# Patient Record
Sex: Male | Born: 2000 | Race: White | Hispanic: No | Marital: Single | Smoking: Current every day smoker
Health system: Southern US, Community
[De-identification: ages and names within clinical notes are randomized; demographics above are authoritative.]

## PROBLEM LIST (undated history)

## (undated) ENCOUNTER — Ambulatory Visit: Payer: Self-pay | Source: Ambulatory Visit

## (undated) DIAGNOSIS — R51 Headache: Secondary | ICD-10-CM

## (undated) DIAGNOSIS — J45909 Unspecified asthma, uncomplicated: Secondary | ICD-10-CM

## (undated) DIAGNOSIS — I10 Essential (primary) hypertension: Secondary | ICD-10-CM

## (undated) HISTORY — DX: Unspecified asthma, uncomplicated: J45.909

## (undated) HISTORY — DX: Headache: R51

## (undated) HISTORY — DX: Essential (primary) hypertension: I10

---
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
## 2020-03-21 ENCOUNTER — Observation Stay: Payer: BC Managed Care – PPO

## 2020-03-21 ENCOUNTER — Encounter
Admission: EM | Disposition: A | Discharge status: Home / Self Care | Payer: Self-pay | Source: Home / Self Care | Attending: Emergency Medicine

## 2020-03-21 ENCOUNTER — Encounter: Payer: Self-pay | Admitting: Emergency Medicine

## 2020-03-21 ENCOUNTER — Emergency Department: Payer: BC Managed Care – PPO

## 2020-03-21 ENCOUNTER — Observation Stay: Payer: BC Managed Care – PPO | Admitting: Certified Registered"

## 2020-03-21 ENCOUNTER — Other Ambulatory Visit: Payer: Self-pay

## 2020-03-21 ENCOUNTER — Ambulatory Visit
Admission: EM | Admit: 2020-03-21 | Discharge: 2020-03-22 | Disposition: A | Discharge status: Home / Self Care | Payer: BC Managed Care – PPO | Attending: Orthopedic Surgery | Admitting: Orthopedic Surgery

## 2020-03-21 DIAGNOSIS — Z79899 Other long term (current) drug therapy: Secondary | ICD-10-CM | POA: Diagnosis not present

## 2020-03-21 DIAGNOSIS — Z8781 Personal history of (healed) traumatic fracture: Secondary | ICD-10-CM

## 2020-03-21 DIAGNOSIS — S52322A Displaced transverse fracture of shaft of left radius, initial encounter for closed fracture: Secondary | ICD-10-CM | POA: Diagnosis not present

## 2020-03-21 DIAGNOSIS — S52202A Unspecified fracture of shaft of left ulna, initial encounter for closed fracture: Secondary | ICD-10-CM | POA: Diagnosis not present

## 2020-03-21 DIAGNOSIS — S5292XA Unspecified fracture of left forearm, initial encounter for closed fracture: Secondary | ICD-10-CM | POA: Diagnosis present

## 2020-03-21 DIAGNOSIS — S52302A Unspecified fracture of shaft of left radius, initial encounter for closed fracture: Secondary | ICD-10-CM | POA: Insufficient documentation

## 2020-03-21 DIAGNOSIS — J45909 Unspecified asthma, uncomplicated: Secondary | ICD-10-CM | POA: Diagnosis not present

## 2020-03-21 DIAGNOSIS — F17290 Nicotine dependence, other tobacco product, uncomplicated: Secondary | ICD-10-CM | POA: Insufficient documentation

## 2020-03-21 DIAGNOSIS — Z03818 Encounter for observation for suspected exposure to other biological agents ruled out: Secondary | ICD-10-CM | POA: Diagnosis not present

## 2020-03-21 DIAGNOSIS — X58XXXA Exposure to other specified factors, initial encounter: Secondary | ICD-10-CM | POA: Insufficient documentation

## 2020-03-21 DIAGNOSIS — Z20822 Contact with and (suspected) exposure to covid-19: Secondary | ICD-10-CM | POA: Diagnosis not present

## 2020-03-21 DIAGNOSIS — I10 Essential (primary) hypertension: Secondary | ICD-10-CM | POA: Diagnosis not present

## 2020-03-21 DIAGNOSIS — F909 Attention-deficit hyperactivity disorder, unspecified type: Secondary | ICD-10-CM | POA: Insufficient documentation

## 2020-03-21 DIAGNOSIS — Z9889 Other specified postprocedural states: Secondary | ICD-10-CM

## 2020-03-21 HISTORY — PX: ORIF WRIST FRACTURE: SHX2133

## 2020-03-21 LAB — SARS CORONAVIRUS 2 BY RT PCR (HOSPITAL ORDER, PERFORMED IN ~~LOC~~ HOSPITAL LAB): SARS Coronavirus 2: NEGATIVE

## 2020-03-21 SURGERY — OPEN REDUCTION INTERNAL FIXATION (ORIF) WRIST FRACTURE
Anesthesia: General | Site: Wrist | Laterality: Left

## 2020-03-21 MED ORDER — CEFAZOLIN SODIUM 1 G IJ SOLR
INTRAMUSCULAR | Status: AC
  Filled 2020-03-21: qty 20

## 2020-03-21 MED ORDER — MIDAZOLAM HCL 2 MG/2ML IJ SOLN
INTRAMUSCULAR | Status: DC | PRN
  Administered 2020-03-21: 2 mg via INTRAVENOUS

## 2020-03-21 MED ORDER — HYDROCODONE-ACETAMINOPHEN 7.5-325 MG PO TABS
1.0000 | ORAL_TABLET | ORAL | Status: DC | PRN
  Administered 2020-03-21: 1 via ORAL
  Filled 2020-03-21: qty 1

## 2020-03-21 MED ORDER — FENTANYL CITRATE (PF) 100 MCG/2ML IJ SOLN
INTRAMUSCULAR | Status: AC
  Administered 2020-03-21: 25 ug via INTRAVENOUS
  Filled 2020-03-21: qty 2

## 2020-03-21 MED ORDER — NEOMYCIN-POLYMYXIN B GU 40-200000 IR SOLN
Status: DC | PRN
  Administered 2020-03-21: 2 mL

## 2020-03-21 MED ORDER — MIDAZOLAM HCL 2 MG/2ML IJ SOLN
INTRAMUSCULAR | Status: AC
  Filled 2020-03-21: qty 2

## 2020-03-21 MED ORDER — ACETAMINOPHEN 325 MG PO TABS: 325.0000 mg | ORAL_TABLET | Freq: Four times a day (QID) | ORAL | Status: DC | PRN

## 2020-03-21 MED ORDER — TRAMADOL HCL 50 MG PO TABS
50.0000 mg | ORAL_TABLET | Freq: Four times a day (QID) | ORAL | Status: DC
  Administered 2020-03-21 – 2020-03-22 (×2): 50 mg via ORAL
  Filled 2020-03-21 (×3): qty 1

## 2020-03-21 MED ORDER — KETOROLAC TROMETHAMINE 30 MG/ML IJ SOLN
INTRAMUSCULAR | Status: DC | PRN
  Administered 2020-03-21: 30 mg via INTRAVENOUS

## 2020-03-21 MED ORDER — ONDANSETRON HCL 4 MG/2ML IJ SOLN
INTRAMUSCULAR | Status: AC
  Administered 2020-03-21: 4 mg via INTRAVENOUS
  Filled 2020-03-21: qty 2

## 2020-03-21 MED ORDER — FENTANYL CITRATE (PF) 100 MCG/2ML IJ SOLN
25.0000 ug | INTRAMUSCULAR | Status: DC | PRN
  Administered 2020-03-21 (×4): 25 ug via INTRAVENOUS

## 2020-03-21 MED ORDER — METHOCARBAMOL 1000 MG/10ML IJ SOLN
500.0000 mg | Freq: Four times a day (QID) | INTRAVENOUS | Status: DC | PRN
  Filled 2020-03-21: qty 5

## 2020-03-21 MED ORDER — ONDANSETRON HCL 4 MG/2ML IJ SOLN: 4.0000 mg | Freq: Once | INTRAMUSCULAR | Status: AC | PRN

## 2020-03-21 MED ORDER — ACETAMINOPHEN 10 MG/ML IV SOLN
INTRAVENOUS | Status: AC
  Filled 2020-03-21: qty 100

## 2020-03-21 MED ORDER — METOCLOPRAMIDE HCL 5 MG/ML IJ SOLN: 5.0000 mg | Freq: Three times a day (TID) | INTRAMUSCULAR | Status: DC | PRN

## 2020-03-21 MED ORDER — FENTANYL CITRATE (PF) 100 MCG/2ML IJ SOLN
INTRAMUSCULAR | Status: AC
  Filled 2020-03-21: qty 2

## 2020-03-21 MED ORDER — METOCLOPRAMIDE HCL 10 MG PO TABS: 5.0000 mg | ORAL_TABLET | Freq: Three times a day (TID) | ORAL | Status: DC | PRN

## 2020-03-21 MED ORDER — ZOLPIDEM TARTRATE 5 MG PO TABS: 5.0000 mg | ORAL_TABLET | Freq: Every evening | ORAL | Status: DC | PRN

## 2020-03-21 MED ORDER — ONDANSETRON HCL 4 MG/2ML IJ SOLN: 4.0000 mg | Freq: Four times a day (QID) | INTRAMUSCULAR | Status: DC | PRN

## 2020-03-21 MED ORDER — LACTATED RINGERS IV SOLN: INTRAVENOUS | Status: DC | PRN

## 2020-03-21 MED ORDER — DEXAMETHASONE SODIUM PHOSPHATE 10 MG/ML IJ SOLN
INTRAMUSCULAR | Status: DC | PRN
  Administered 2020-03-21: 5 mg via INTRAVENOUS

## 2020-03-21 MED ORDER — MORPHINE SULFATE (PF) 2 MG/ML IV SOLN
2.0000 mg | INTRAVENOUS | Status: DC | PRN
  Administered 2020-03-21: 2 mg via INTRAVENOUS
  Filled 2020-03-21: qty 1

## 2020-03-21 MED ORDER — LIDOCAINE HCL (CARDIAC) PF 100 MG/5ML IV SOSY
PREFILLED_SYRINGE | INTRAVENOUS | Status: DC | PRN
  Administered 2020-03-21: 100 mg via INTRAVENOUS

## 2020-03-21 MED ORDER — HYDROCODONE-ACETAMINOPHEN 5-325 MG PO TABS
1.0000 | ORAL_TABLET | ORAL | Status: DC | PRN
  Administered 2020-03-21: 1 via ORAL
  Filled 2020-03-21: qty 1

## 2020-03-21 MED ORDER — DEXMEDETOMIDINE HCL 200 MCG/2ML IV SOLN
INTRAVENOUS | Status: DC | PRN
  Administered 2020-03-21: 12 ug via INTRAVENOUS
  Administered 2020-03-21: 8 ug via INTRAVENOUS

## 2020-03-21 MED ORDER — PHENYLEPHRINE HCL (PRESSORS) 10 MG/ML IV SOLN
INTRAVENOUS | Status: DC | PRN
  Administered 2020-03-21 (×2): 100 ug via INTRAVENOUS

## 2020-03-21 MED ORDER — FENTANYL CITRATE (PF) 100 MCG/2ML IJ SOLN
INTRAMUSCULAR | Status: DC | PRN
  Administered 2020-03-21 (×2): 100 ug via INTRAVENOUS

## 2020-03-21 MED ORDER — METHOCARBAMOL 500 MG PO TABS
500.0000 mg | ORAL_TABLET | Freq: Four times a day (QID) | ORAL | Status: DC | PRN
  Administered 2020-03-21 – 2020-03-22 (×2): 500 mg via ORAL
  Filled 2020-03-21 (×2): qty 1

## 2020-03-21 MED ORDER — MORPHINE SULFATE (PF) 2 MG/ML IV SOLN
0.5000 mg | INTRAVENOUS | Status: DC | PRN
  Administered 2020-03-21: 1 mg via INTRAVENOUS
  Filled 2020-03-21: qty 1

## 2020-03-21 MED ORDER — FENTANYL CITRATE (PF) 100 MCG/2ML IJ SOLN
50.0000 ug | INTRAMUSCULAR | Status: DC | PRN
  Administered 2020-03-21: 50 ug via NASAL
  Filled 2020-03-21: qty 2

## 2020-03-21 MED ORDER — CEFAZOLIN SODIUM-DEXTROSE 2-4 GM/100ML-% IV SOLN
2.0000 g | INTRAVENOUS | Status: AC
  Administered 2020-03-21: 2 g via INTRAVENOUS
  Filled 2020-03-21: qty 100

## 2020-03-21 MED ORDER — ONDANSETRON HCL 4 MG PO TABS: 4.0000 mg | ORAL_TABLET | Freq: Four times a day (QID) | ORAL | Status: DC | PRN

## 2020-03-21 MED ORDER — PROPOFOL 10 MG/ML IV BOLUS
INTRAVENOUS | Status: DC | PRN
  Administered 2020-03-21: 200 mg via INTRAVENOUS

## 2020-03-21 MED ORDER — DOCUSATE SODIUM 100 MG PO CAPS
100.0000 mg | ORAL_CAPSULE | Freq: Two times a day (BID) | ORAL | Status: DC
  Administered 2020-03-21: 100 mg via ORAL
  Filled 2020-03-21: qty 1

## 2020-03-21 MED ORDER — CEFAZOLIN SODIUM-DEXTROSE 2-4 GM/100ML-% IV SOLN
2.0000 g | Freq: Four times a day (QID) | INTRAVENOUS | Status: DC
  Filled 2020-03-21: qty 100

## 2020-03-21 MED ORDER — ONDANSETRON HCL 4 MG/2ML IJ SOLN
INTRAMUSCULAR | Status: DC | PRN
  Administered 2020-03-21: 4 mg via INTRAVENOUS

## 2020-03-21 MED ORDER — SEVOFLURANE IN SOLN
RESPIRATORY_TRACT | Status: AC
  Filled 2020-03-21: qty 250

## 2020-03-21 MED ORDER — ACETAMINOPHEN 10 MG/ML IV SOLN
INTRAVENOUS | Status: DC | PRN
  Administered 2020-03-21: 1000 mg via INTRAVENOUS

## 2020-03-21 MED ORDER — KETOROLAC TROMETHAMINE 30 MG/ML IJ SOLN
INTRAMUSCULAR | Status: AC
  Filled 2020-03-21: qty 1

## 2020-03-21 MED ORDER — SODIUM CHLORIDE 0.9 % IV SOLN: INTRAVENOUS | Status: DC

## 2020-03-21 SURGICAL SUPPLY — 42 items
BIT DRILL 110X2.5XQCK CNCT (BIT) ×1 IMPLANT
BIT DRILL 2.5 (BIT) ×3
BIT DRL 110X2.5XQCK CNCT (BIT) ×1
BNDG ELASTIC 4X5.8 VLCR NS LF (GAUZE/BANDAGES/DRESSINGS) ×3 IMPLANT
BNDG ELASTIC 4X5.8 VLCR STR LF (GAUZE/BANDAGES/DRESSINGS) ×3 IMPLANT
CANISTER SUCT 1200ML W/VALVE (MISCELLANEOUS) ×3 IMPLANT
CHLORAPREP W/TINT 26 (MISCELLANEOUS) ×3 IMPLANT
COVER WAND RF STERILE (DRAPES) ×3 IMPLANT
CUFF TOURN SGL QUICK 18X4 (TOURNIQUET CUFF) ×3 IMPLANT
DRAPE FLUOR MINI C-ARM 54X84 (DRAPES) ×3 IMPLANT
ELECT CAUTERY BLADE 6.4 (BLADE) ×3 IMPLANT
ELECT REM PT RETURN 9FT ADLT (ELECTROSURGICAL) ×3
ELECTRODE REM PT RTRN 9FT ADLT (ELECTROSURGICAL) ×1 IMPLANT
GAUZE SPONGE 4X4 12PLY STRL (GAUZE/BANDAGES/DRESSINGS) ×3 IMPLANT
GAUZE XEROFORM 1X8 LF (GAUZE/BANDAGES/DRESSINGS) ×3 IMPLANT
GLOVE SURG SYN 9.0  PF PI (GLOVE) ×3
GLOVE SURG SYN 9.0 PF PI (GLOVE) ×1 IMPLANT
GOWN SRG 2XL LVL 4 RGLN SLV (GOWNS) ×1 IMPLANT
GOWN STRL NON-REIN 2XL LVL4 (GOWNS) ×3
GOWN STRL REUS W/ TWL LRG LVL3 (GOWN DISPOSABLE) ×1 IMPLANT
GOWN STRL REUS W/TWL LRG LVL3 (GOWN DISPOSABLE) ×3
KIT TURNOVER KIT A (KITS) ×3 IMPLANT
NEEDLE FILTER BLUNT 18X 1/2SAF (NEEDLE) ×2
NEEDLE FILTER BLUNT 18X1 1/2 (NEEDLE) ×1 IMPLANT
NS IRRIG 500ML POUR BTL (IV SOLUTION) ×3 IMPLANT
PACK EXTREMITY (MISCELLANEOUS) ×3 IMPLANT
PAD CAST CTTN 4X4 STRL (SOFTGOODS) ×1 IMPLANT
PADDING CAST COTTON 4X4 STRL (SOFTGOODS) ×3
PROS LCP PLATE 6H 85MM (Plate) ×6 IMPLANT
PROSTHESIS LCP PLATE 6H 85MM (Plate) ×2 IMPLANT
SCALPEL PROTECTED #15 DISP (BLADE) ×6 IMPLANT
SCREW CORTICAL 3.5MM  16MM (Screw) ×6 IMPLANT
SCREW CORTICAL 3.5MM  20MM (Screw) ×12 IMPLANT
SCREW CORTICAL 3.5MM 16MM (Screw) ×2 IMPLANT
SCREW CORTICAL 3.5MM 18MM (Screw) ×18 IMPLANT
SCREW CORTICAL 3.5MM 20MM (Screw) ×4 IMPLANT
SPLINT CAST 1 STEP 3X12 (MISCELLANEOUS) ×3 IMPLANT
SUT ETHILON 4-0 (SUTURE) ×3
SUT ETHILON 4-0 FS2 18XMFL BLK (SUTURE) ×1
SUT VICRYL 3-0 27IN (SUTURE) ×3 IMPLANT
SUTURE ETHLN 4-0 FS2 18XMF BLK (SUTURE) ×1 IMPLANT
SYR 3ML LL SCALE MARK (SYRINGE) ×3 IMPLANT

## 2020-03-21 NOTE — ED Notes (Signed)
Dr. Menz in with pt and family   

## 2020-03-21 NOTE — Op Note (Signed)
03/21/2020  6:35 PM  PATIENT:  Marliss Coots  19 y.o. male  PRE-OPERATIVE DIAGNOSIS:  Left Forearm Fracture both bone midshaft  POST-OPERATIVE DIAGNOSIS:  Left Forearm Fracture both bone midshaft  PROCEDURE: Open reduction internal fixation left ulna and left radius  SURGEON: Leitha Schuller, MD  ASSISTANTS: None  ANESTHESIA:   general  EBL:  Total I/O In: 800 [I.V.:700; IV Piggyback:100] Out: 10 [Blood:10]  BLOOD ADMINISTERED:none  DRAINS: none   LOCAL MEDICATIONS USED:  NONE  SPECIMEN:  No Specimen  DISPOSITION OF SPECIMEN:  N/A  COUNTS:  YES  TOURNIQUET:   Total Tourniquet Time Documented: Upper Arm (Left) - 52 minutes Total: Upper Arm (Left) - 52 minutes   IMPLANTS: 6 hole small fragment Synthes plates with multiple cortical screws  DICTATION: .Dragon Dictation patient was brought to the operating room and after adequate anesthesia was obtained the left arm was prepped and draped in the usual sterile fashion.  After patient identification and timeout procedures were completed, tourniquet was raised to 250 mmHg.  The ulnar was approached first with direct exposure to the ulna centered over the fracture site with the skin and subcutaneous tissue divided hemostasis achieved electrocautery dividing the flexors from the extensors off the ulnar border.  When this was done the there was significant periosteal stripping already present and this was extended proximally and distally on the volar surface such that the distal fragment could be grasped with a bone forceps and reduced anatomically to the shaft more proximally.  There was splintering that made interdigitation and could anatomic reduction possible.  After this was done a 6-hole plate was applied and cortical screws were placed with proximal and distal screws followed by compression screw in a neutral screws alignment was anatomic under mini C arm views in both AP and lateral projections.  Going to the radius a volar  approach was made with division between the FCR and brachial radialis and getting down to the bone protecting the superficial radial nerve and radial artery.  The periosteum was stripped here as well and was minimally displaced after reduction of the ulna with application of a volar plate and proximal distal screw fixation there was full pronation and supination and alignment seemed anatomic for both fractures.  The remaining screws were filled with a compression screw to get further compression at the fracture site.  When all screw holes were filled the wound was thoroughly irrigated and tourniquet let down.  There is no significant bleeding.  The wound was closed with 3-0 Vicryl subcutaneously and 4-0 nylon in short running sutures.  Xeroform 4 x 4's web roll and a splint were applied to maintain neutral supination pronation allow for soft tissue healing.  Along with an Ace wrap.  PLAN OF CARE: Admit for overnight observation  PATIENT DISPOSITION:  PACU - hemodynamically stable.

## 2020-03-21 NOTE — Anesthesia Procedure Notes (Signed)
Procedure Name: LMA Insertion Date/Time: 03/21/2020 4:49 PM Performed by: Jaye Beagle, CRNA Pre-anesthesia Checklist: Patient identified, Emergency Drugs available, Suction available, Patient being monitored and Timeout performed Patient Re-evaluated:Patient Re-evaluated prior to induction Oxygen Delivery Method: Circle system utilized Preoxygenation: Pre-oxygenation with 100% oxygen Induction Type: IV induction Ventilation: Mask ventilation without difficulty LMA: LMA inserted LMA Size: 4.5 Number of attempts: 1 Tube secured with: Tape Dental Injury: Teeth and Oropharynx as per pre-operative assessment

## 2020-03-21 NOTE — Anesthesia Postprocedure Evaluation (Signed)
Anesthesia Post Note  Patient: Keith Perez  Procedure(s) Performed: OPEN REDUCTION INTERNAL FIXATION (ORIF) WRIST FRACTURE (Left Wrist)  Patient location during evaluation: PACU Anesthesia Type: General Level of consciousness: awake and alert Pain management: pain level controlled Vital Signs Assessment: post-procedure vital signs reviewed and stable Respiratory status: spontaneous breathing and respiratory function stable Cardiovascular status: stable Anesthetic complications: no   No complications documented.   Last Vitals:  Vitals:   03/21/20 1918 03/21/20 1936  BP: 128/79 138/69  Pulse: 77 75  Resp: 15 16  Temp: 36.9 C 36.9 C  SpO2: 93% 98%    Last Pain:  Vitals:   03/21/20 1936  TempSrc: Oral  PainSc:                  Kadarius Cuffe K

## 2020-03-21 NOTE — ED Notes (Addendum)
See triage note   Presents s/p fall from Keith Perez he was on a slight incline fell  Caught his arm on the fence Injury to left forearm  positive deformity noted    Good pulses

## 2020-03-21 NOTE — ED Triage Notes (Signed)
Patient to ER for c/o left arm injury after accident during mowing. Patient states he was on an incline and mower started to slip out of from under him. States he tried to catch himself and got arm caught in fence, heard a snap. Patient has possible deformity to left forearm with small puncture wounds present with bleeding controlled.

## 2020-03-21 NOTE — ED Provider Notes (Signed)
[facility] Emergency Department Provider Note   ____________________________________________   First MD Initiated Contact with Patient 03/21/20 1341     (approximate)  I have reviewed the triage vital signs and the nursing notes.   HISTORY  Chief Complaint Arm Injury    HPI Keith Perez is a 19 y.o. male patient presents with deformity to left forearm secondary to a fall from a lawnmower.  Patient state his arm was caught in a fence as he fell he felt a crack resulting in deformity.  Patient rates pain as 9/10.  Patient described the pain is "achy".  Ice pack applied prior to arrival.         Past Medical History:  Diagnosis Date  . Asthma   . Headache(784.0)   . Hypertension     Patient Active Problem List   Diagnosis Date Noted  . Forearm fractures, both bones, closed, left, initial encounter 03/21/2020  . Tension headache 08/16/2013  . Anxiety state, unspecified 08/16/2013  . ADHD (attention deficit hyperactivity disorder) 08/16/2013    History reviewed. No pertinent surgical history.  Prior to Admission medications   Medication Sig Start Date End Date Taking? Authorizing Provider  Albuterol Sulfate (PROAIR HFA IN) Inhale 2 puffs into the lungs every 4 (four) hours as needed.    [provider]  ibuprofen (ADVIL,MOTRIN) 200 MG tablet Take 400 mg by mouth every 6 (six) hours as needed.    [provider]  LISINOPRIL PO Take by mouth.    [provider]  magnesium gluconate (MAGONATE) 500 MG tablet Take 500 mg by mouth 2 (two) times daily.    [provider]  propranolol (INDERAL) 20 MG tablet Take 1 tablet (20 mg total) by mouth 2 (two) times daily. (Start with 20 mg by mouth each bedtime for the first week) 08/16/13   Keturah Shavers, MD  VYVANSE 40 MG capsule 10 mg.  08/07/13   [provider]    Allergies Other  Family History  Problem Relation Age of Onset  . ADD / ADHD Mother         ADD  . Depression Mother   . Irritable bowel syndrome Mother   . ADD / ADHD Father        ADHD  . ADD / ADHD Paternal Aunt        ADHD  . Depression Maternal Grandmother   . Anxiety disorder Maternal Grandmother   . Cancer Maternal Grandmother   . Sleep apnea Maternal Grandmother   . Diabetes Other        Fhx of Diabetes  . Hypertension Other        Fhx Hypertension  . Heart disease Other   . Stroke Other   . Cancer Paternal Grandfather     Social History Social History   Tobacco Use  . Smoking status: Never Smoker  . Smokeless tobacco: Never Used  Vaping Use  . Vaping Use: Every day  Substance Use Topics  . Alcohol use: Never  . Drug use: Not on file    Review of Systems  Constitutional: No fever/chills Eyes: No visual changes. ENT: No sore throat. Cardiovascular: Denies chest pain. Respiratory: Denies shortness of breath. Gastrointestinal: No abdominal pain.  No nausea, no vomiting.  No diarrhea.  No constipation. Genitourinary: Negative for dysuria. Musculoskeletal: Deformity left forearm. Skin: Negative for rash. Neurological: Negative for headaches, focal weakness or numbness. Endocrine:  Hypertension ____________________________________________   PHYSICAL EXAM:  VITAL SIGNS: ED Triage  Vitals [03/21/20 1253]  Enc Vitals Group     BP 130/81     Pulse Rate (!) 50     Resp 20     Temp 98 F (36.7 C)     Temp Source Oral     SpO2 99 %     Weight 215 lb (97.5 kg)     Height 5\' 10"  (1.778 m)     Head Circumference      Peak Flow      Pain Score 9     Pain Loc      Pain Edu?      Excl. in Blunt?    Constitutional: Alert and oriented. Well appearing and in no acute distress. Cardiovascular: Normal rate, regular rhythm. Grossly normal heart sounds.  Good peripheral circulation. Respiratory: Normal respiratory effort.  No retractions. Lungs CTAB. Musculoskeletal: Obvious deformity to the midshaft left forearm.   Neurologic:  Normal speech and  language. No gross focal neurologic deficits are appreciated. No gait instability. Skin:  Skin is warm, dry and intact. No rash noted. Psychiatric: Mood and affect are normal. Speech and behavior are normal.  ____________________________________________   LABS (all labs ordered are listed, but only abnormal results are displayed)  Labs Reviewed  SARS CORONAVIRUS 2 BY RT PCR (HOSPITAL ORDER, [facility])   ____________________________________________  EKG   ____________________________________________  [place]  ED MD interpretation:    Official radiology report(s): DG Forearm Left  Result Date: 03/21/2020 CLINICAL DATA:  Left arm pain following a fall and crush injury. EXAM: LEFT FOREARM - 2 VIEW COMPARISON:  None. FINDINGS: Transverse fractures of the mid shafts of the radius and ulna with ulnar angulation and rotation of the distal fragments. There is also almost 1 shaft width of ventral displacement and 1/2 shaft width of radial displacement of the distal ulna fragment. IMPRESSION: Displaced and angulated radial and ulnar shaft fractures, as described above. Electronically Signed   By: Claudie Revering M.D.   On: 03/21/2020 14:15    ____________________________________________   PROCEDURES  Procedure(s) performed (including Critical Care):  Procedures   ____________________________________________   INITIAL IMPRESSION / ASSESSMENT AND PLAN / ED COURSE  As part of my medical decision making, I reviewed the following data within the [facility]     Patient presents with midshaft ulnar radial fracture secondary to fall.  Discussed x-ray findings with patient.  Patient will be admitted by Dr. Rudene Christians.          ____________________________________________   FINAL CLINICAL IMPRESSION(S) / ED DIAGNOSES  Final diagnoses:  Closed fracture of left forearm, initial encounter     ED Discharge Orders    None       Note:   This document was prepared using Dragon voice recognition software and may include unintentional dictation errors.    Sable Feil, PA-C 03/21/20 1513    Harvest Dark, MD 03/21/20 [phone]

## 2020-03-21 NOTE — H&P (Signed)
Subjective:   Patient is a 19 y.o. male presents with left forearm pain. Onset of symptoms was abrupt starting 2 hours ago with unchanged course since that time. The pain is located in the mid forearm. Patient describes the pain as sharp continuous and rated as moderate and severe. Pain has been associated with fall and crush injury he was riding a lawnmower when it slid down the hill.  Pressing his left arm against a fence he had immediate deformity and was brought to the emergency room was found to have both bone forearm fracture on the left side as well as volar abrasion to the forearm. Patient denies loss of consciousness.  He is right-hand dominant and very physically active  Patient Active Problem List   Diagnosis Date Noted  . Forearm fractures, both bones, closed, left, initial encounter 03/21/2020  . Tension headache 08/16/2013  . Anxiety state, unspecified 08/16/2013  . ADHD (attention deficit hyperactivity disorder) 08/16/2013   Past Medical History:  Diagnosis Date  . Asthma   . Headache(784.0)   . Hypertension     History reviewed. No pertinent surgical history.  (Not in a hospital admission)  Allergies  Allergen Reactions  . Other     Seasonal    Social History   Tobacco Use  . Smoking status: Never Smoker  . Smokeless tobacco: Never Used  Substance Use Topics  . Alcohol use: Never    Family History  Problem Relation Age of Onset  . ADD / ADHD Mother        ADD  . Depression Mother   . Irritable bowel syndrome Mother   . ADD / ADHD Father        ADHD  . ADD / ADHD Paternal Aunt        ADHD  . Depression Maternal Grandmother   . Anxiety disorder Maternal Grandmother   . Cancer Maternal Grandmother   . Sleep apnea Maternal Grandmother   . Diabetes Other        Fhx of Diabetes  . Hypertension Other        Fhx Hypertension  . Heart disease Other   . Stroke Other   . Cancer Paternal Grandfather     Review of Systems Pertinent items are noted in  HPI.  Objective:   Patient Vitals for the past 8 hrs:  BP Temp Temp src Pulse Resp SpO2 Height Weight  03/21/20 1253 130/81 98 F (36.7 C) Oral (!) 50 20 99 % 5\' 10"  (1.778 m) 97.5 kg   No intake/output data recorded. No intake/output data recorded.    BP 130/81 (BP Location: Left Arm)   Pulse (!) 50   Temp 98 F (36.7 C) (Oral)   Resp 20   Ht 5\' 10"  (1.778 m)   Wt 97.5 kg   SpO2 99%   BMI 30.85 kg/m  Lungs: clear to auscultation bilaterally Heart: regular rate and rhythm, S1, S2 normal, no murmur, click, rub or gallop Extremities: He has slight deformity to the mid forearm with an abrasion to the volar skin in the midshaft area.  This is superficial no does not appear to have any deep penetrating wound.  Or inside out bone lesion.  Distally he has palpable radial artery pulses has intact sensation in the radial ulnar and median nerve distributions.  He is able to flex extend the fingers.  ECG: None.  Data ReviewRadiology review: Both bone forearm fracture with some displacement.  Assessment:   Active Problems:   Forearm fractures,  both bones, closed, left, initial encounter   Plan:   ORIF both bones.  Discussed with him and his mother that this is a fracture that requires ORIF in adults.  Risk of neurovascular injury and infection.  And potential need for plate removal in the future.  As he has not had anything to eat or drink today we will proceed with surgery today before he gets more swelling which will make the case more difficult.  We will be performing surgery after Covid testing completed.

## 2020-03-21 NOTE — Anesthesia Preprocedure Evaluation (Signed)
Anesthesia Evaluation  Patient identified by MRN, date of birth, ID band Patient awake    Reviewed: Allergy & Precautions, NPO status , Patient's Chart, lab work & pertinent test results  History of Anesthesia Complications Negative for: history of anesthetic complications  Airway Mallampati: II       Dental   Pulmonary asthma (no inhalers x 7 yrs) , neg sleep apnea, Current Smoker (vapes),           Cardiovascular hypertension (no meds since 40 lb weight loss), (-) Past MI and (-) CHF (-) dysrhythmias (-) Valvular Problems/Murmurs     Neuro/Psych neg Seizures    GI/Hepatic Neg liver ROS, neg GERD  ,  Endo/Other  neg diabetes  Renal/GU negative Renal ROS     Musculoskeletal   Abdominal   Peds  Hematology   Anesthesia Other Findings   Reproductive/Obstetrics                             Anesthesia Physical Anesthesia Plan  ASA: II and emergent  Anesthesia Plan: General   Post-op Pain Management:    Induction: Intravenous  PONV Risk Score and Plan: 2  Airway Management Planned: LMA  Additional Equipment:   Intra-op Plan:   Post-operative Plan:   Informed Consent: I have reviewed the patients History and Physical, chart, labs and discussed the procedure including the risks, benefits and alternatives for the proposed anesthesia with the patient or authorized representative who has indicated his/her understanding and acceptance.       Plan Discussed with:   Anesthesia Plan Comments:         Anesthesia Quick Evaluation

## 2020-03-21 NOTE — Transfer of Care (Signed)
Immediate Anesthesia Transfer of Care Note  Patient: Keith Perez  Procedure(s) Performed: OPEN REDUCTION INTERNAL FIXATION (ORIF) WRIST FRACTURE (Left Wrist)  Patient Location: PACU  Anesthesia Type:General  Level of Consciousness: drowsy  Airway & Oxygen Therapy: Patient Spontanous Breathing and Patient connected to nasal cannula oxygen  Post-op Assessment: Report given to RN  Post vital signs: stable  Last Vitals:  Vitals Value Taken Time  BP 115/76 03/21/20 1832  Temp 36.1 C 03/21/20 1832  Pulse 104 03/21/20 1834  Resp 19 03/21/20 1834  SpO2 97 % 03/21/20 1834  Vitals shown include unvalidated device data.  Last Pain:  Vitals:   03/21/20 1253  TempSrc: Oral  PainSc: 9          Complications: No complications documented.

## 2020-03-22 MED ORDER — METHOCARBAMOL 500 MG PO TABS: 500.0000 mg | ORAL_TABLET | Freq: Four times a day (QID) | ORAL | 0 refills | Status: DC | PRN

## 2020-03-22 MED ORDER — TRAMADOL HCL 50 MG PO TABS: 50.0000 mg | ORAL_TABLET | Freq: Four times a day (QID) | ORAL | 0 refills | Status: DC

## 2020-03-22 MED ORDER — HYDROCODONE-ACETAMINOPHEN 5-325 MG PO TABS: 1.0000 | ORAL_TABLET | ORAL | 0 refills | Status: DC | PRN

## 2020-03-22 NOTE — Progress Notes (Signed)
Patient is being discharged to home today. Mom is bedside to take him home.  IV removed. DC & Rx instructions given and patient acknowledged understanding.

## 2020-03-22 NOTE — Discharge Instructions (Signed)
INSTRUCTIONS AFTER Surgery  o Remove items at home which could result in a fall. This includes throw rugs or furniture in walking pathways o ICE to the affected joint every three hours while awake for 30 minutes at a time, for at least the first 3-5 days, and then as needed for pain and swelling.  Continue to use ice for pain and swelling. You may notice swelling that will progress down to the foot and ankle.  This is normal after surgery.  Elevate your leg when you are not up walking on it.     DIET:  As you were doing prior to hospitalization, we recommend a well-balanced diet.  DRESSING / WOUND CARE / SHOWERING  Keep the surgical dressing until follow up.  The dressing is water proof, so you can shower without any extra covering.  IF THE DRESSING FALLS OFF or the wound gets wet inside, change the dressing with sterile gauze.  Please use good hand washing techniques before changing the dressing.  Do not use any lotions or creams on the incision until instructed by your surgeon.    ACTIVITY  o Increase activity slowly as tolerated, but follow the weight bearing instructions below.   o No driving for 6 weeks or until further direction given by your physician.  You cannot drive while taking narcotics.  o No lifting or carrying greater than 10 lbs. until further directed by your surgeon. o Avoid periods of inactivity such as sitting longer than an hour when not asleep. This helps prevent blood clots.  o You may return to work once you are authorized by your doctor.     WEIGHT BEARING   Other:  Keep the shoulder sling on for activities.  Give extra support to the forearm.  Can be removed when resting.  No getting the forearm wet.   EXERCISES  Gentle wrist and finger exercises.  No forearm exercises.  CONSTIPATION  Constipation is defined medically as fewer than three stools per week and severe constipation as less than one stool per week.  Even if you have a regular bowel pattern at  home, your normal regimen is likely to be disrupted due to multiple reasons following surgery.  Combination of anesthesia, postoperative narcotics, change in appetite and fluid intake all can affect your bowels.   YOU MUST use at least one of the following options; they are listed in order of increasing strength to get the job done.  They are all available over the counter, and you may need to use some, POSSIBLY even all of these options:    Drink plenty of fluids (prune juice may be helpful) and high fiber foods Colace 100 mg by mouth twice a day  Senokot for constipation as directed and as needed Dulcolax (bisacodyl), take with full glass of water  Miralax (polyethylene glycol) once or twice a day as needed.  If you have tried all these things and are unable to have a bowel movement in the first 3-4 days after surgery call either your surgeon or your primary doctor.    If you experience loose stools or diarrhea, hold the medications until you stool forms back up.  If your symptoms do not get better within 1 week or if they get worse, check with your doctor.  If you experience "the worst abdominal pain ever" or develop nausea or vomiting, please contact the office immediately for further recommendations for treatment.   ITCHING:  If you experience itching with your medications, try taking  only a single pain pill, or even half a pain pill at a time.  You can also use Benadryl over the counter for itching or also to help with sleep.   MEDICATIONS:  See your medication summary on the "After Visit Summary" that nursing will review with you.  You may have some home medications which will be placed on hold until you complete the course of blood thinner medication.  It is important for you to complete the blood thinner medication as prescribed.  PRECAUTIONS:  If you experience chest pain or shortness of breath - call 911 immediately for transfer to the hospital emergency department.   If you develop a  fever greater that 101 F, purulent drainage from wound, increased redness or drainage from wound, foul odor from the wound/dressing, or calf pain - CONTACT YOUR SURGEON.                                                   FOLLOW-UP APPOINTMENTS:  If you do not already have a post-op appointment, please call the office for an appointment to be seen by your surgeon.  Guidelines for how soon to be seen are listed in your "After Visit Summary", but are typically between 1-2 weeks after surgery.  OTHER INSTRUCTIONS:   Limit activities with the left arm.  MAKE SURE YOU:  . Understand these instructions.  . Get help right away if you are not doing well or get worse.    Thank you for letting us be a part of your medical care team.  It is a privilege we respect greatly.  We hope these instructions will help you stay on track for a fast and full recovery!

## 2020-03-22 NOTE — Discharge Summary (Addendum)
Physician Discharge Summary  Subjective: 1 Day Post-Op Procedure(s) (LRB): OPEN REDUCTION INTERNAL FIXATION (ORIF) WRIST FRACTURE (Left) Patient reports pain as mild.   Patient seen in rounds with Dr. Rudene Christians. Patient is well, and has had no acute complaints or problems Patient is ready to go home  Physician Discharge Summary  Patient ID: Keith Perez MRN: [id number] DOB/AGE: 2001-01-23 19 y.o.  Admit date: 03/21/2020 Discharge date: 03/22/2020  Admission Diagnoses:  Discharge Diagnoses:  Active Problems:   Forearm fractures, both bones, closed, left, initial encounter   S/P ORIF (open reduction internal fixation) fracture   Discharged Condition: fair  Hospital Course: The patient is postop day 1 from a left forearm open reduction internal fixation.  He is doing very well since surgery.  His pain control is more manageable this morning.  He has been able to move his hand and fingers well and has good sensation.  He is ready to go home.  Treatments: surgery:  Open reduction internal fixation left ulna and left radius  SURGEON: Laurene Footman, MD  ASSISTANTS: None  ANESTHESIA:   general  EBL:  Total I/O In: 800 [I.V.:700; IV Piggyback:100] Out: 10 [Blood:10]  BLOOD ADMINISTERED:none  DRAINS: none   LOCAL MEDICATIONS USED:  NONE  SPECIMEN:  No Specimen  DISPOSITION OF SPECIMEN:  N/A  COUNTS:  YES  TOURNIQUET:   Total Tourniquet Time Documented: Upper Arm (Left) - 52 minutes Total: Upper Arm (Left) - 52 minutes   IMPLANTS: 6 hole small fragment Synthes plates with multiple cortical screws  Discharge Exam: Blood pressure 109/62, pulse 96, temperature 98.2 F (36.8 C), temperature source Oral, resp. rate 16, height 5\' 11"  (1.803 m), weight 103.2 kg, SpO2 99 %.   Disposition: Discharge disposition: 01-Home or Self Care        Allergies as of 03/22/2020      Reactions   Other Other (See Comments)   Seasonal Seasonal      Medication  List    TAKE these medications   HYDROcodone-acetaminophen 5-325 MG tablet Commonly known as: NORCO/VICODIN Take 1-2 tablets by mouth every 4 (four) hours as needed for moderate pain (pain score 4-6).   methocarbamol 500 MG tablet Commonly known as: ROBAXIN Take 1 tablet (500 mg total) by mouth every 6 (six) hours as needed for muscle spasms.   traMADol 50 MG tablet Commonly known as: ULTRAM Take 1 tablet (50 mg total) by mouth every 6 (six) hours.       Follow-up Information    Hessie Knows, MD. Schedule an appointment as soon as possible for a visit in 1 week(s).   Specialty: Orthopedic Surgery Why: For x-rays of the forearm Contact information: [place] [address] [phone]               Signed: Prescott Parma, Eiman Maret 03/22/2020, 7:31 AM   Objective: Vital signs in last 24 hours: Temp:  [97 F (36.1 C)-98.5 F (36.9 C)] 98.2 F (36.8 C) (06/12 0407) Pulse Rate:  [50-98] 96 (06/12 0407) Resp:  [9-20] 16 (06/12 0407) BP: (109-155)/(62-89) 109/62 (06/12 0407) SpO2:  [90 %-100 %] 99 % (06/12 0407) Weight:  [97.5 kg-103.2 kg] 103.2 kg (06/11 1936)  Intake/Output from previous day:  Intake/Output Summary (Last 24 hours) at 03/22/2020 0731 Last data filed at 03/22/2020 0200 Gross per 24 hour  Intake 2723.33 ml  Output 510 ml  Net 2213.33 ml    Intake/Output this shift: No intake/output data recorded.  Labs:  No results for input(s): HGB in the last 72 hours. No results for input(s): WBC, RBC, HCT, PLT in the last 72 hours. No results for input(s): NA, K, CL, CO2, BUN, CREATININE, GLUCOSE, CALCIUM in the last 72 hours. No results for input(s): LABPT, INR in the last 72 hours.  EXAM: General - Patient is Alert and Oriented Extremity - Neurovascular intact Sensation intact distally Compartment soft Incision - clean, dry, no drainage Motor Function -flexion and extension of the fingers and hand  intact.  Assessment/Plan: 1 Day Post-Op Procedure(s) (LRB): OPEN REDUCTION INTERNAL FIXATION (ORIF) WRIST FRACTURE (Left) Procedure(s) (LRB): OPEN REDUCTION INTERNAL FIXATION (ORIF) WRIST FRACTURE (Left) Past Medical History:  Diagnosis Date  . Asthma   . Headache(784.0)   . Hypertension    Active Problems:   Forearm fractures, both bones, closed, left, initial encounter   S/P ORIF (open reduction internal fixation) fracture  Estimated body mass index is 31.73 kg/m as calculated from the following:   Height as of this encounter: 5\' 11"  (1.803 m).   Weight as of this encounter: 103.2 kg. Advance diet Diet - Regular diet Follow up - in 1-2 weeks Activity - WBAT Disposition - Home Condition Upon Discharge - Stable DVT Prophylaxis - None  , PA-C Orthopaedic Surgery 03/22/2020, 7:31 AM

## 2020-03-22 NOTE — Progress Notes (Signed)
  Subjective: 1 Day Post-Op Procedure(s) (LRB): OPEN REDUCTION INTERNAL FIXATION (ORIF) WRIST FRACTURE (Left) Patient reports pain as mild.   Patient is well, and has had no acute complaints or problems Plan is to go Home after hospital stay. Negative for chest pain and shortness of breath Fever: no Gastrointestinal: Negative for nausea and vomiting  Objective: Vital signs in last 24 hours: Temp:  [97 F (36.1 C)-98.5 F (36.9 C)] 98.2 F (36.8 C) (06/12 0407) Pulse Rate:  [50-98] 96 (06/12 0407) Resp:  [9-20] 16 (06/12 0407) BP: (109-155)/(62-89) 109/62 (06/12 0407) SpO2:  [90 %-100 %] 99 % (06/12 0407) Weight:  [97.5 kg-103.2 kg] 103.2 kg (06/11 1936)  Intake/Output from previous day:  Intake/Output Summary (Last 24 hours) at 03/22/2020 0630 Last data filed at 03/22/2020 0200 Gross per 24 hour  Intake 2723.33 ml  Output 510 ml  Net 2213.33 ml    Intake/Output this shift: Total I/O In: 1923.3 [P.O.:240; I.V.:1683.3] Out: 500 [Urine:500]  Labs: No results for input(s): HGB in the last 72 hours. No results for input(s): WBC, RBC, HCT, PLT in the last 72 hours. No results for input(s): NA, K, CL, CO2, BUN, CREATININE, GLUCOSE, CALCIUM in the last 72 hours. No results for input(s): LABPT, INR in the last 72 hours.   EXAM General - Patient is Alert and Oriented Extremity - Neurovascular intact Sensation intact distally Dorsiflexion/Plantar flexion intact Compartment soft Dressing/Incision - clean, dry, no drainage Motor Function - intact, moving fingers and hand well on exam.   Past Medical History:  Diagnosis Date  . Asthma   . Headache(784.0)   . Hypertension     Assessment/Plan: 1 Day Post-Op Procedure(s) (LRB): OPEN REDUCTION INTERNAL FIXATION (ORIF) WRIST FRACTURE (Left) Active Problems:   Forearm fractures, both bones, closed, left, initial encounter   S/P ORIF (open reduction internal fixation) fracture  Estimated body mass index is 31.73 kg/m as  calculated from the following:   Height as of this encounter: 5\' 11"  (1.803 m).   Weight as of this encounter: 103.2 kg. Advance diet  Discharge home  DVT Prophylaxis - None Shoulder sling for forearm support with activities.  , PA-C Orthopaedic Surgery 03/22/2020, 6:30 AM

## 2020-03-24 ENCOUNTER — Encounter: Payer: Self-pay | Admitting: Orthopedic Surgery

## 2020-03-26 DIAGNOSIS — Z8781 Personal history of (healed) traumatic fracture: Secondary | ICD-10-CM | POA: Diagnosis not present

## 2020-03-26 DIAGNOSIS — Z9889 Other specified postprocedural states: Secondary | ICD-10-CM | POA: Diagnosis not present

## 2020-04-04 DIAGNOSIS — Z8781 Personal history of (healed) traumatic fracture: Secondary | ICD-10-CM | POA: Diagnosis not present

## 2020-04-04 DIAGNOSIS — Z9889 Other specified postprocedural states: Secondary | ICD-10-CM | POA: Diagnosis not present

## 2020-05-02 DIAGNOSIS — Z8781 Personal history of (healed) traumatic fracture: Secondary | ICD-10-CM | POA: Diagnosis not present

## 2020-05-02 DIAGNOSIS — Z9889 Other specified postprocedural states: Secondary | ICD-10-CM | POA: Diagnosis not present

## 2020-06-09 ENCOUNTER — Ambulatory Visit
Admission: EM | Admit: 2020-06-09 | Discharge: 2020-06-09 | Disposition: A | Discharge status: Home / Self Care | Payer: BC Managed Care – PPO | Attending: Internal Medicine | Admitting: Internal Medicine

## 2020-06-09 DIAGNOSIS — Z20822 Contact with and (suspected) exposure to covid-19: Secondary | ICD-10-CM | POA: Diagnosis not present

## 2020-06-09 NOTE — Discharge Instructions (Signed)

## 2020-06-11 LAB — NOVEL CORONAVIRUS, NAA: SARS-CoV-2, NAA: DETECTED — AB

## 2020-06-25 DIAGNOSIS — Z8781 Personal history of (healed) traumatic fracture: Secondary | ICD-10-CM | POA: Diagnosis not present

## 2020-06-25 DIAGNOSIS — Z9889 Other specified postprocedural states: Secondary | ICD-10-CM | POA: Diagnosis not present

## 2021-03-27 ENCOUNTER — Encounter: Payer: Self-pay | Admitting: Family Medicine

## 2021-03-27 ENCOUNTER — Other Ambulatory Visit: Payer: Self-pay

## 2021-03-27 ENCOUNTER — Ambulatory Visit: Payer: BC Managed Care – PPO | Admitting: Family Medicine

## 2021-03-27 VITALS — BP 120/86 | HR 57 | Ht 70.0 in | Wt 221.2 lb

## 2021-03-27 DIAGNOSIS — R454 Irritability and anger: Secondary | ICD-10-CM

## 2021-03-27 DIAGNOSIS — Z7689 Persons encountering health services in other specified circumstances: Secondary | ICD-10-CM | POA: Diagnosis not present

## 2021-03-27 MED ORDER — CITALOPRAM HYDROBROMIDE 10 MG PO TABS: 10.0000 mg | ORAL_TABLET | Freq: Every day | ORAL | 3 refills | Status: DC

## 2021-03-27 MED ORDER — LORAZEPAM 1 MG PO TABS
1.0000 mg | ORAL_TABLET | Freq: Two times a day (BID) | ORAL | 1 refills | Status: DC | PRN
Start: 2021-03-27 — End: 2021-10-19

## 2021-03-27 NOTE — Assessment & Plan Note (Signed)
Patient describes quickness to anger that has been happening for some time now. He thinks his mother is Bipolar but does not seek treatment. He denies increased energy, spending or depression. He denies SI/Hi.   Plan- Ref to Psych for evaluation./ Starting Celexa 10 mg with Prn Ativan.

## 2021-03-27 NOTE — Progress Notes (Signed)
Amb psy

## 2021-03-27 NOTE — Progress Notes (Signed)
Established Patient Office Visit  SUBJECTIVE:  Subjective  Patient ID: Keith Perez, male    DOB: [birth year]  Age: 20 y.o. MRN: [id number]  CC:  Chief Complaint  Patient presents with   New Patient (Initial Visit)    HPI Keith Perez is a 20 y.o. male presenting today for   Past Medical History:  Diagnosis Date   Asthma    Headache(784.0)    Hypertension     Past Surgical History:  Procedure Laterality Date   ORIF WRIST FRACTURE Left 03/21/2020   Procedure: OPEN REDUCTION INTERNAL FIXATION (ORIF) WRIST FRACTURE;  Surgeon: Hessie Knows, MD;  Location: [facility] ORS;  Service: Orthopedics;  Laterality: Left;    Family History  Problem Relation Age of Onset   ADD / ADHD Mother        ADD   Depression Mother    Irritable bowel syndrome Mother    ADD / ADHD Father        ADHD   ADD / ADHD Paternal Aunt        ADHD   Depression Maternal Grandmother    Anxiety disorder Maternal Grandmother    Cancer Maternal Grandmother    Sleep apnea Maternal Grandmother    Diabetes Other        Fhx of Diabetes   Hypertension Other        Fhx Hypertension   Heart disease Other    Stroke Other    Cancer Paternal Grandfather     Social History   Socioeconomic History   Marital status: Single    Spouse name: Not on file   Number of children: Not on file   Years of education: Not on file   Highest education level: Not on file  Occupational History   Not on file  Tobacco Use   Smoking status: Every Day    Pack years: 0.00    Types: E-cigarettes   Smokeless tobacco: Never  Vaping Use   Vaping Use: Every day  Substance and Sexual Activity   Alcohol use: Yes   Drug use: Yes    Types: Marijuana   Sexual activity: Yes  Other Topics Concern   Not on file  Social History Narrative   Not on file   Social Determinants of Health   Financial Resource Strain: Not on file  Food Insecurity: Not on file  Transportation Needs: Not on file  Physical Activity: Not on file   Stress: Not on file  Social Connections: Not on file  Intimate Partner Violence: Not on file     Current Outpatient Medications:    citalopram (CELEXA) 10 MG tablet, Take 1 tablet (10 mg total) by mouth daily., Disp: 30 tablet, Rfl: 3   LORazepam (ATIVAN) 1 MG tablet, Take 1 tablet (1 mg total) by mouth 2 (two) times daily as needed for anxiety., Disp: 20 tablet, Rfl: 1   Allergies  Allergen Reactions   Other Other (See Comments)    Seasonal Seasonal    ROS Review of Systems  Constitutional: Negative.   HENT: Negative.    Respiratory: Negative.    Cardiovascular: Negative.   Genitourinary: Negative.   Neurological: Negative.   Hematological: Negative.   Psychiatric/Behavioral: Negative.      OBJECTIVE:    Physical Exam Constitutional:      Appearance: Normal appearance.  HENT:     Head: Normocephalic.  Cardiovascular:     Rate and Rhythm: Normal rate and regular rhythm.  Psychiatric:  Mood and Affect: Affect is angry.        Behavior: Behavior normal.    BP 120/86   Pulse (!) 57   Ht 5' 10"  (1.778 m)   Wt 221 lb 3.2 oz (100.3 kg)   BMI 31.74 kg/m  Wt Readings from Last 3 Encounters:  03/27/21 221 lb 3.2 oz (100.3 kg) (97 %, Z= 1.87)*  03/21/20 227 lb 8.2 oz (103.2 kg) (98 %, Z= 2.06)*  08/16/13 118 lb 6.4 oz (53.7 kg) (87 %, Z= 1.15)*   * Growth percentiles are based on CDC (Boys, 2-20 Years) data.    Health Maintenance Due  Topic Date Due   COVID-19 Vaccine (1) Never done   Pneumococcal Vaccine 34-17 Years old (1 - PCV) Never done   HPV VACCINES (1 - Male 2-dose series) Never done   HIV Screening  Never done   Hepatitis C Screening  Never done   TETANUS/TDAP  Never done       Topic Date Due   HPV VACCINES (1 - Male 2-dose series) Never done    No flowsheet data found. No flowsheet data found.  No results found for: TSH No results found for: ALBUMIN, ANIONGAP, EGFR, GFR No results found for: CHOL, HDL, LDLCALC, CHOLHDL No results  found for: TRIG No results found for: HGBA1C    ASSESSMENT & PLAN:   Problem List Items Addressed This Visit       Other   Encounter to establish care - Primary    Patient in today to establish care, health good in general. This is his first adult provider.        Difficulty controlling anger    Patient describes quickness to anger that has been happening for some time now. He thinks his mother is Bipolar but does not seek treatment. He denies increased energy, spending or depression. He denies SI/Hi.   Plan- Ref to Psych for evaluation./ Starting Celexa 10 mg with Prn Ativan.        Relevant Orders   Ambulatory referral to Psychiatry    Meds ordered this encounter  Medications   citalopram (CELEXA) 10 MG tablet    Sig: Take 1 tablet (10 mg total) by mouth daily.    Dispense:  30 tablet    Refill:  3   LORazepam (ATIVAN) 1 MG tablet    Sig: Take 1 tablet (1 mg total) by mouth 2 (two) times daily as needed for anxiety.    Dispense:  20 tablet    Refill:  1      Follow-up: No follow-ups on file.    Beckie Salts, [place] [address] [place], [place] [address]

## 2021-03-27 NOTE — Assessment & Plan Note (Addendum)
Patient in today to establish care, health good in general. This is his first adult provider.

## 2021-04-10 ENCOUNTER — Ambulatory Visit: Payer: BC Managed Care – PPO | Admitting: Family Medicine

## 2021-05-15 ENCOUNTER — Other Ambulatory Visit: Payer: Self-pay | Admitting: Family Medicine

## 2021-10-13 ENCOUNTER — Emergency Department: Payer: Self-pay

## 2021-10-13 ENCOUNTER — Other Ambulatory Visit: Payer: Self-pay

## 2021-10-13 ENCOUNTER — Emergency Department
Admission: EM | Admit: 2021-10-13 | Discharge: 2021-10-13 | Disposition: A | Discharge status: Home / Self Care | Payer: Self-pay | Attending: Emergency Medicine | Admitting: Emergency Medicine

## 2021-10-13 DIAGNOSIS — R197 Diarrhea, unspecified: Secondary | ICD-10-CM | POA: Insufficient documentation

## 2021-10-13 DIAGNOSIS — R079 Chest pain, unspecified: Secondary | ICD-10-CM | POA: Insufficient documentation

## 2021-10-13 DIAGNOSIS — R0602 Shortness of breath: Secondary | ICD-10-CM | POA: Insufficient documentation

## 2021-10-13 DIAGNOSIS — Z5321 Procedure and treatment not carried out due to patient leaving prior to being seen by health care provider: Secondary | ICD-10-CM | POA: Insufficient documentation

## 2021-10-13 LAB — CBC
HCT: 48.6 % (ref 39.0–52.0)
Hemoglobin: 17.2 g/dL — ABNORMAL HIGH (ref 13.0–17.0)
MCH: 29.5 pg (ref 26.0–34.0)
MCHC: 35.4 g/dL (ref 30.0–36.0)
MCV: 83.2 fL (ref 80.0–100.0)
Platelets: 400 10*3/uL (ref 150–400)
RBC: 5.84 MIL/uL — ABNORMAL HIGH (ref 4.22–5.81)
RDW: 12 % (ref 11.5–15.5)
WBC: 12.1 10*3/uL — ABNORMAL HIGH (ref 4.0–10.5)
nRBC: 0 % (ref 0.0–0.2)

## 2021-10-13 LAB — BASIC METABOLIC PANEL
Anion gap: 12 (ref 5–15)
BUN: 14 mg/dL (ref 6–20)
CO2: 22 mmol/L (ref 22–32)
Calcium: 9.9 mg/dL (ref 8.9–10.3)
Chloride: 106 mmol/L (ref 98–111)
Creatinine, Ser: 1 mg/dL (ref 0.61–1.24)
GFR, Estimated: >60 mL/min (ref >60)
Glucose, Bld: 123 mg/dL — ABNORMAL HIGH (ref 70–99)
Potassium: 3.9 mmol/L (ref 3.5–5.1)
Sodium: 140 mmol/L (ref 135–145)

## 2021-10-13 LAB — TROPONIN I (HIGH SENSITIVITY)
Troponin I (High Sensitivity): <2 ng/L (ref <18)
Troponin I (High Sensitivity): 3 ng/L (ref <18)

## 2021-10-13 NOTE — ED Triage Notes (Signed)
Pt to ED for centralized chest pain that started over past few days. Reports a lot of stress recently.  Pt hyperventilating in triage, states feeling tingling. Pt encouraged to take slow deep breaths.  +diarrhea started this am.  +shob started earlier this week.   MSE Sammie Bench in triage

## 2021-10-13 NOTE — ED Provider Notes (Signed)
°  Emergency Medicine Provider Triage Evaluation Note  Keith Perez , a 20 y.o.male,  was evaluated in triage.  Pt complains of chest pain.  This been going on intermittently for the past 2 weeks.  He says he has been extremely stressed this past week.  He is currently feeling central chest pain and is hyperventilating.  States that he feels tingling in his arms.  Denies fever/chills, abdominal pain, back pain, urinary symptoms   Review of Systems  Positive: Chest pain Negative: Denies fever, chest pain, vomiting  Physical Exam   Vitals:   10/13/21 1514 10/13/21 1519  BP:  (!) 149/124  Pulse: (!) 117   Resp: (!) 22   Temp:  98 F (36.7 C)  SpO2: 100%    Gen:   Awake, distressed Resp:  Tachypneic MSK:   Moves extremities without difficulty  Other:    Medical Decision Making  Given the patient's initial medical screening exam, the following diagnostic evaluation has been ordered. The patient will be placed in the appropriate treatment space, once one is available, to complete the evaluation and treatment. I have discussed the plan of care with the patient and I have advised the patient that an ED physician or mid-level practitioner will reevaluate their condition after the test results have been received, as the results may give them additional insight into the type of treatment they may need.    Diagnostics: Labs, EKG, CXR  Treatments: none immediately   Varney Daily, [place] 10/13/21 1523    Phineas Semen, MD 10/13/21 [phone]

## 2021-10-19 ENCOUNTER — Ambulatory Visit (INDEPENDENT_AMBULATORY_CARE_PROVIDER_SITE_OTHER): Payer: Self-pay | Admitting: Internal Medicine

## 2021-10-19 ENCOUNTER — Encounter: Payer: Self-pay | Admitting: Internal Medicine

## 2021-10-19 ENCOUNTER — Other Ambulatory Visit: Payer: Self-pay

## 2021-10-19 VITALS — BP 135/81 | HR 95 | Ht 70.0 in | Wt 203.6 lb

## 2021-10-19 DIAGNOSIS — R454 Irritability and anger: Secondary | ICD-10-CM

## 2021-10-19 DIAGNOSIS — F411 Generalized anxiety disorder: Secondary | ICD-10-CM

## 2021-10-19 DIAGNOSIS — K219 Gastro-esophageal reflux disease without esophagitis: Secondary | ICD-10-CM

## 2021-10-19 DIAGNOSIS — I10 Essential (primary) hypertension: Secondary | ICD-10-CM

## 2021-10-19 NOTE — Progress Notes (Signed)
New Patient Office Visit  Subjective:  Patient ID: Keith Perez, male    DOB: 2001/04/26  Age: 21 y.o. MRN: [id number]  CC:  Chief Complaint  Patient presents with   Hypertension    Chest Pain  This is a new problem. The current episode started in the past 7 days. The problem occurs every several days. The pain is present in the substernal region. The pain is at a severity of 2/10. The pain is mild. The quality of the pain is described as burning. The pain does not radiate. Pertinent negatives include no abdominal pain, back pain, cough, dizziness, exertional chest pressure, irregular heartbeat, lower extremity edema, shortness of breath, vomiting or weakness. He has tried nothing for the symptoms.  Patient presents for bp check up  Past Medical History:  Diagnosis Date   Asthma    Headache(784.0)    Hypertension     No current outpatient medications on file.   Past Surgical History:  Procedure Laterality Date   ORIF WRIST FRACTURE Left 03/21/2020   Procedure: OPEN REDUCTION INTERNAL FIXATION (ORIF) WRIST FRACTURE;  Surgeon: Hessie Knows, MD;  Location: [facility] ORS;  Service: Orthopedics;  Laterality: Left;    Family History  Problem Relation Age of Onset   ADD / ADHD Mother        ADD   Depression Mother    Irritable bowel syndrome Mother    ADD / ADHD Father        ADHD   ADD / ADHD Paternal Aunt        ADHD   Depression Maternal Grandmother    Anxiety disorder Maternal Grandmother    Cancer Maternal Grandmother    Sleep apnea Maternal Grandmother    Diabetes Other        Fhx of Diabetes   Hypertension Other        Fhx Hypertension   Heart disease Other    Stroke Other    Cancer Paternal Grandfather     Social History   Socioeconomic History   Marital status: Single    Spouse name: Not on file   Number of children: Not on file   Years of education: Not on file   Highest education level: Not on file  Occupational History   Not on file  Tobacco Use    Smoking status: Every Day    Types: E-cigarettes   Smokeless tobacco: Never  Vaping Use   Vaping Use: Every day  Substance and Sexual Activity   Alcohol use: Yes   Drug use: Yes    Types: Marijuana   Sexual activity: Yes  Other Topics Concern   Not on file  Social History Narrative   Not on file   Social Determinants of Health   Financial Resource Strain: Not on file  Food Insecurity: Not on file  Transportation Needs: Not on file  Physical Activity: Not on file  Stress: Not on file  Social Connections: Not on file  Intimate Partner Violence: Not on file    ROS Review of Systems  Respiratory:  Negative for cough and shortness of breath.   Cardiovascular:  Positive for chest pain.  Gastrointestinal:  Negative for abdominal pain and vomiting.  Musculoskeletal:  Negative for back pain.  Neurological:  Negative for dizziness and weakness.   Objective:   Today's Vitals: BP 135/81    Pulse 95    Ht 5\' 10"  (1.778 m)    Wt 203 lb 9.6 oz (92.4 kg)  BMI 29.21 kg/m   Physical Exam Nursing note reviewed.  Constitutional:      Appearance: Normal appearance.  HENT:     Head: Normocephalic.     Nose: Nose normal.     Mouth/Throat:     Mouth: Mucous membranes are moist.  Eyes:     Pupils: Pupils are equal, round, and reactive to light.  Cardiovascular:     Rate and Rhythm: Normal rate.     Heart sounds: No murmur heard. Pulmonary:     Effort: Pulmonary effort is normal.     Breath sounds: Normal breath sounds.  Abdominal:     General: Bowel sounds are normal. There is no distension.  Musculoskeletal:        General: Normal range of motion.  Skin:    General: Skin is warm and dry.  Neurological:     General: No focal deficit present.  Psychiatric:        Mood and Affect: Mood normal.        Thought Content: Thought content normal.    Assessment & Plan:   Problem List Items Addressed This Visit       Cardiovascular and Mediastinum   Hypertension - Primary     Blood pressure is under control      Relevant Orders   EKG 12-Lead     Digestive   Chronic GERD      - Instructed the patient  .to avoid eating spicy and acidic foods, as well as foods high in fat. - Instructed the patient to avoid eating large meals or meals 2-3 hours prior to sleeping. priloec 20 mg po daily        Other   Anxiety state    - Patient experiencing high levels of anxiety.  - Encouraged patient to engage in relaxing activities like yoga, meditation, journaling, going for a walk, or participating in a hobby.  - Encouraged patient to reach out to trusted friends or family members about recent struggles, Patient was advised to read A book, how to stop worrying and start living, it is good book to read to control  the stress       Difficulty controlling anger    Stable at the present time       Outpatient Encounter Medications as of 10/19/2021  Medication Sig   [DISCONTINUED] LORazepam (ATIVAN) 1 MG tablet Take 1 tablet (1 mg total) by mouth 2 (two) times daily as needed for anxiety.   [DISCONTINUED] citalopram (CELEXA) 10 MG tablet TAKE 1 TABLET BY MOUTH EVERY DAY (Patient not taking: Reported on 10/19/2021)   No facility-administered encounter medications on file as of 10/19/2021.  Electrocardiogram does not reveal any acute changes, sinus tachycardia is noted  Follow-up: No follow-ups on file.   Cletis Athens, MD

## 2021-10-19 NOTE — Assessment & Plan Note (Signed)
°-   Instructed the patient  .to avoid eating spicy and acidic foods, as well as foods high in fat. - Instructed the patient to avoid eating large meals or meals 2-3 hours prior to sleeping. priloec 20 mg po daily

## 2021-10-19 NOTE — Assessment & Plan Note (Signed)
Blood pressure is under control 

## 2021-10-30 NOTE — Assessment & Plan Note (Signed)
Stable at the present time. 

## 2021-10-30 NOTE — Assessment & Plan Note (Signed)
-   Patient experiencing high levels of anxiety.  - Encouraged patient to engage in relaxing activities like yoga, meditation, journaling, going for a walk, or participating in a hobby.  - Encouraged patient to reach out to trusted friends or family members about recent struggles, Patient was advised to read A book, how to stop worrying and start living, it is good book to read to control  the stress  

## 2021-12-15 ENCOUNTER — Ambulatory Visit: Payer: Self-pay | Admitting: Internal Medicine

## 2023-07-16 IMAGING — CR DG CHEST 2V
2 series · 2 of 2 positions shown · non-contrast
Comparison: 01/08/2009

CLINICAL DATA: Cough and short of breath

EXAM:
CHEST - 2 VIEW

[chest pa]
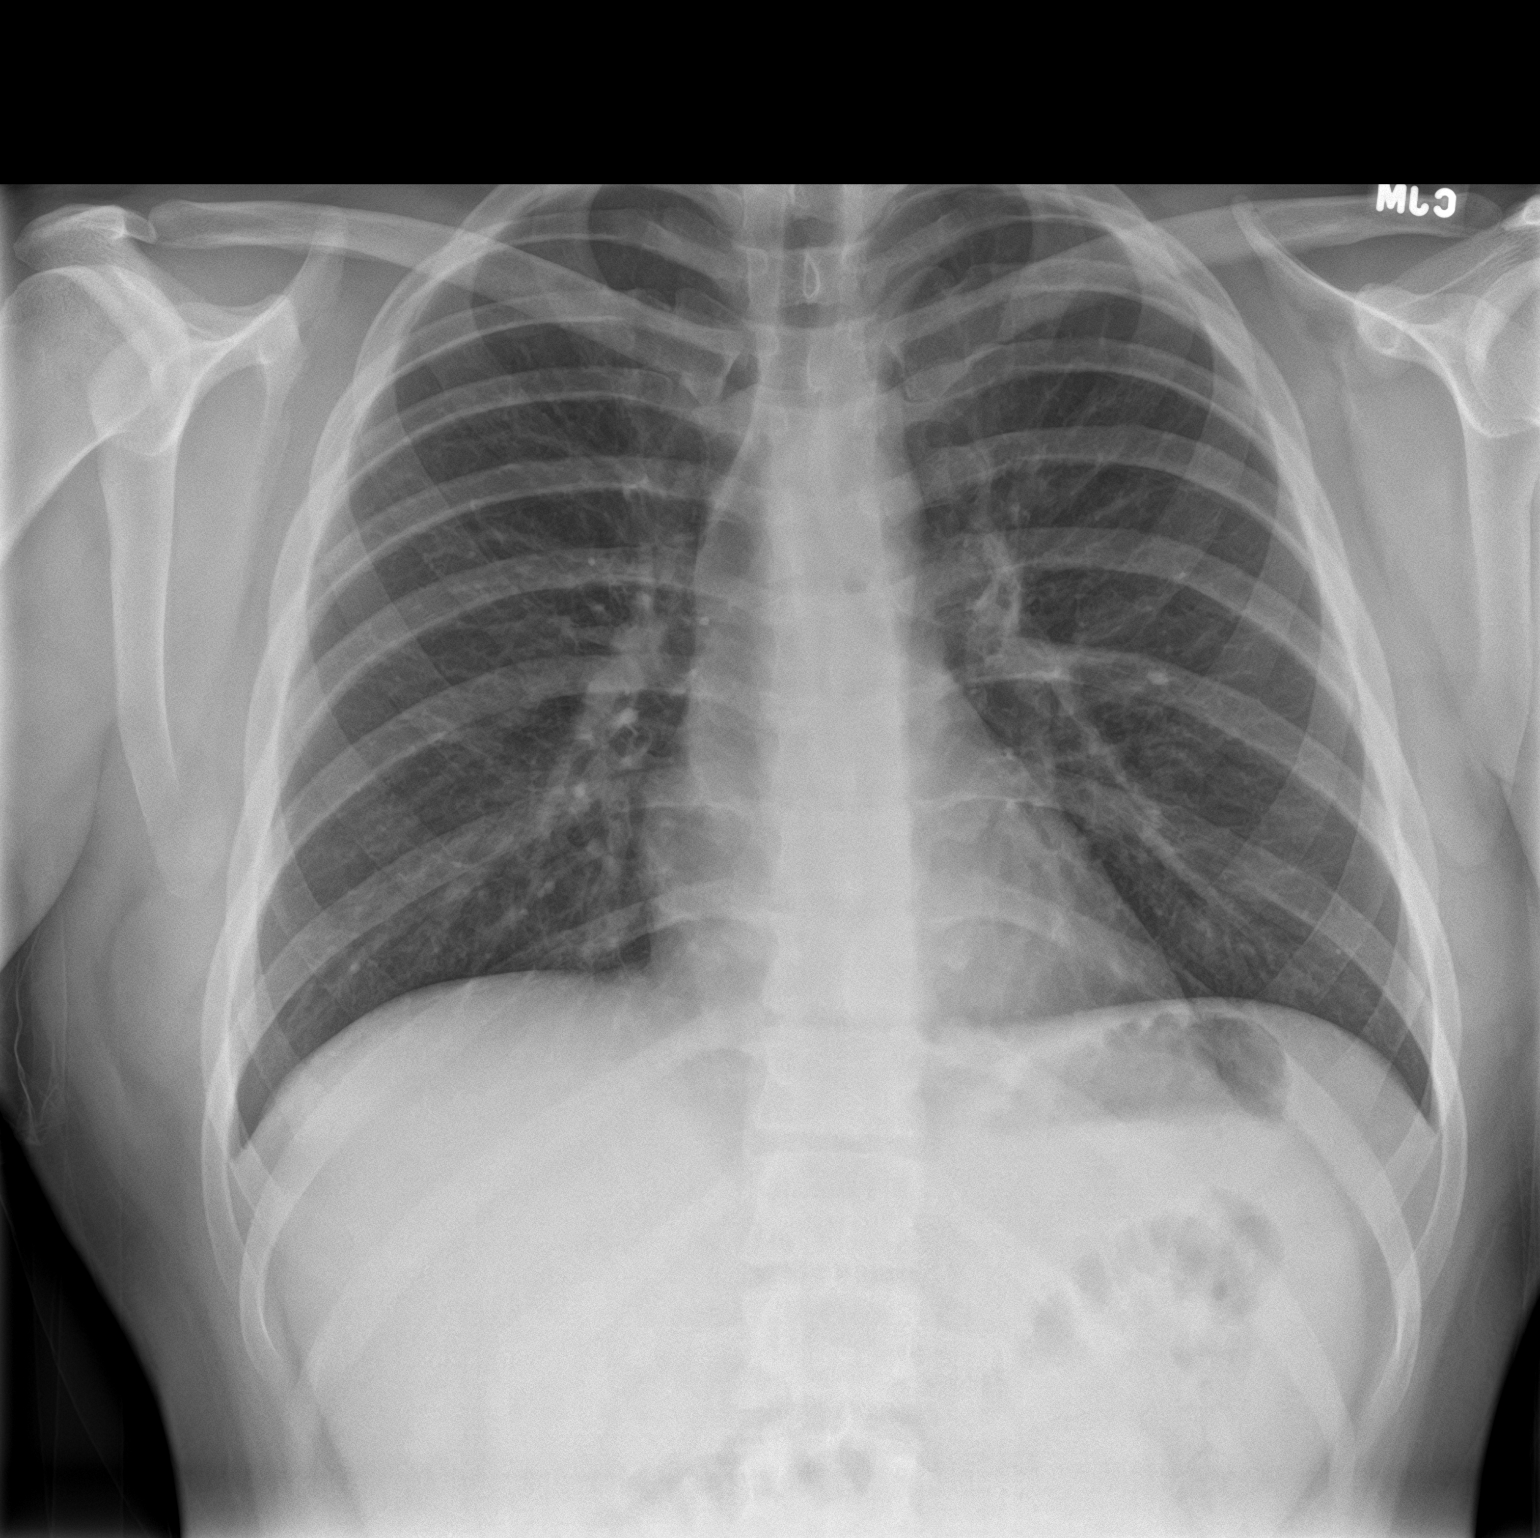

[chest lat]
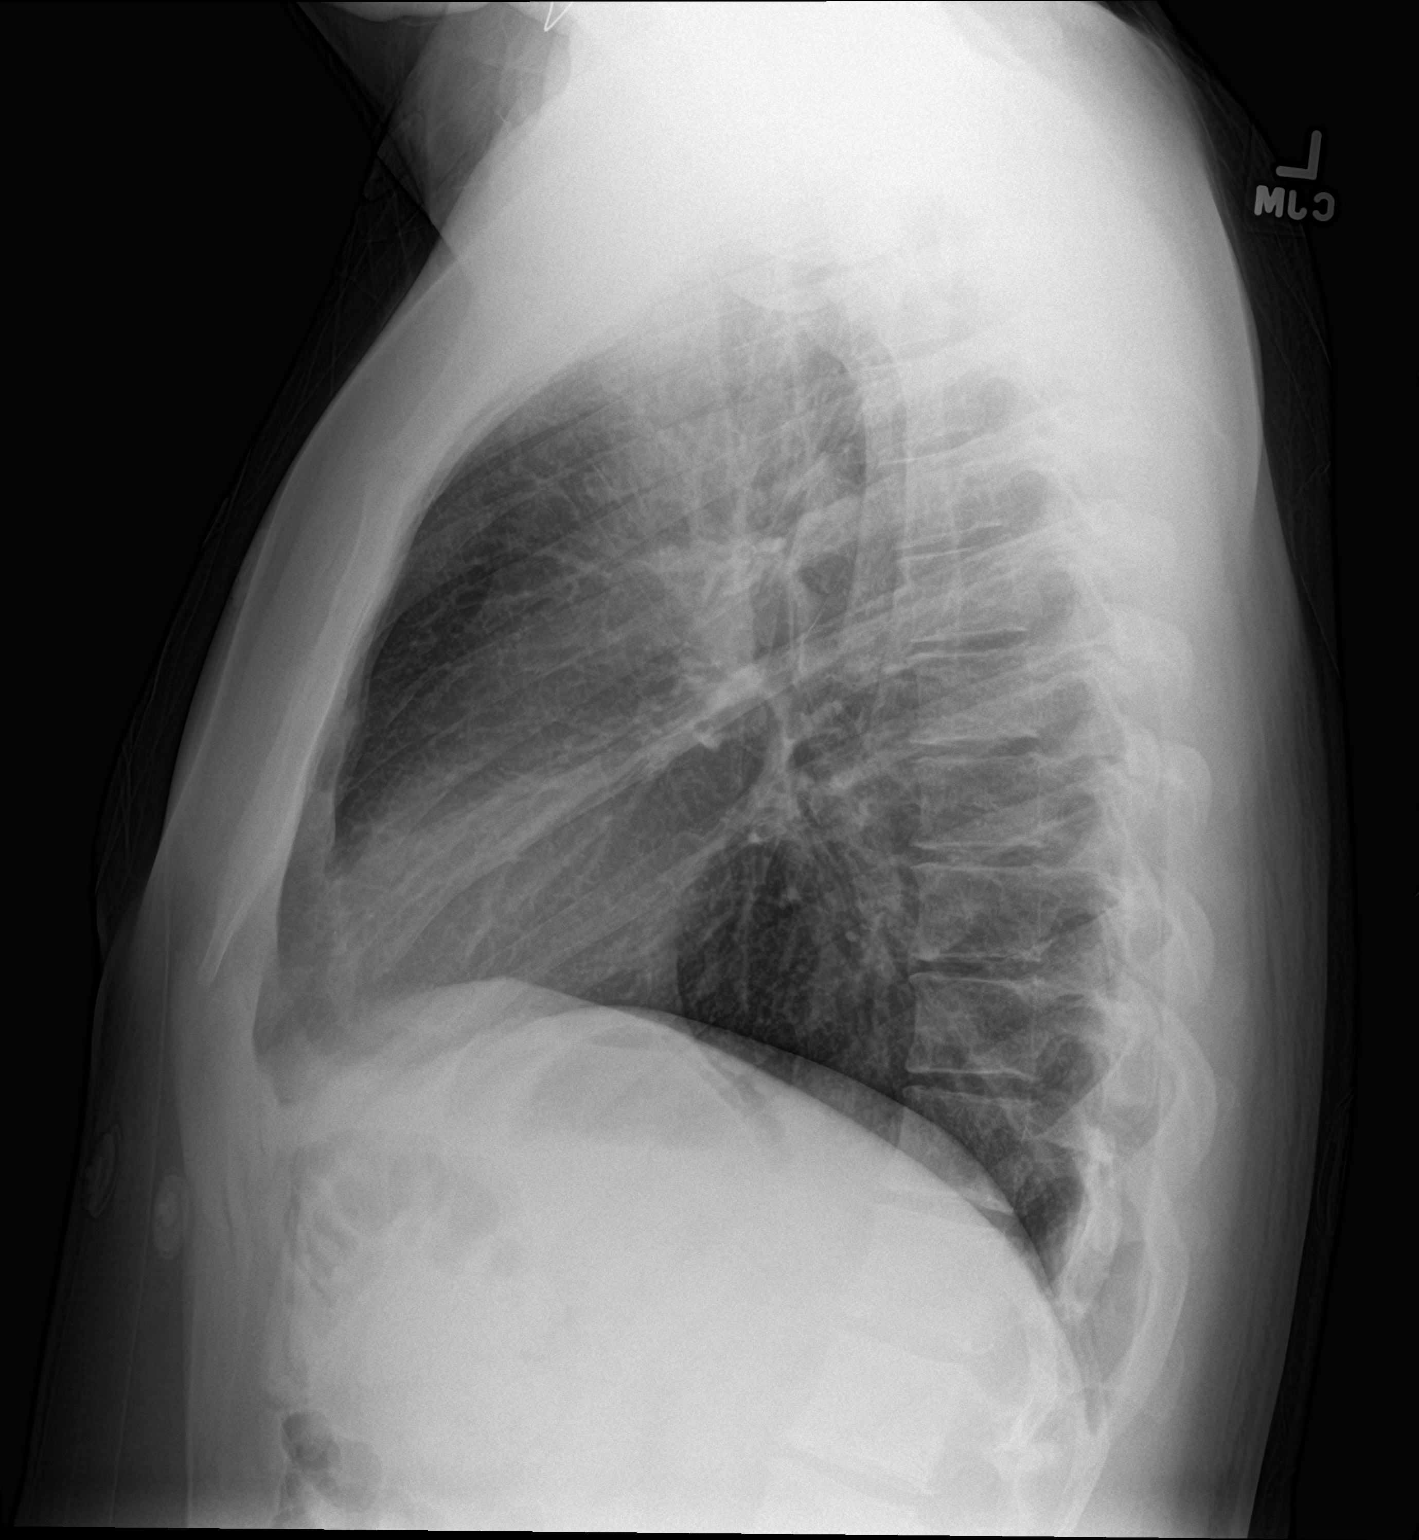

[2 of 2 positions shown; findings below may reference images not displayed]

FINDINGS: The heart size and mediastinal contours are within normal limits.
Both lungs are clear. The visualized skeletal structures are
unremarkable.
IMPRESSION: No active cardiopulmonary disease.
